# Patient Record
Sex: Male | Born: 1965 | Race: Black or African American | Hispanic: No | Marital: Married | Smoking: Former smoker
Health system: Southern US, Community
[De-identification: ages and names within clinical notes are randomized; demographics above are authoritative.]

---

## 2020-11-26 ENCOUNTER — Other Ambulatory Visit: Payer: Self-pay

## 2020-11-26 ENCOUNTER — Emergency Department (HOSPITAL_COMMUNITY)
Admission: EM | Admit: 2020-11-26 | Discharge: 2020-11-27 | Disposition: A | Discharge status: Home / Self Care | Payer: 59 | Attending: Physician Assistant | Admitting: Physician Assistant

## 2020-11-26 ENCOUNTER — Encounter (HOSPITAL_COMMUNITY): Payer: Self-pay | Admitting: Emergency Medicine

## 2020-11-26 ENCOUNTER — Emergency Department (HOSPITAL_COMMUNITY): Payer: 59

## 2020-11-26 DIAGNOSIS — M542 Cervicalgia: Secondary | ICD-10-CM | POA: Diagnosis not present

## 2020-11-26 DIAGNOSIS — M4312 Spondylolisthesis, cervical region: Secondary | ICD-10-CM | POA: Diagnosis not present

## 2020-11-26 DIAGNOSIS — Z041 Encounter for examination and observation following transport accident: Secondary | ICD-10-CM | POA: Diagnosis not present

## 2020-11-26 DIAGNOSIS — M5031 Other cervical disc degeneration,  high cervical region: Secondary | ICD-10-CM | POA: Diagnosis not present

## 2020-11-26 DIAGNOSIS — S0990XA Unspecified injury of head, initial encounter: Secondary | ICD-10-CM | POA: Diagnosis not present

## 2020-11-26 DIAGNOSIS — Y92410 Unspecified street and highway as the place of occurrence of the external cause: Secondary | ICD-10-CM | POA: Insufficient documentation

## 2020-11-26 DIAGNOSIS — M50321 Other cervical disc degeneration at C4-C5 level: Secondary | ICD-10-CM | POA: Diagnosis not present
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
## 2020-11-26 NOTE — ED Notes (Signed)
Pt called for triage, no answer, unable to locate pt.

## 2020-11-27 MED ORDER — METHOCARBAMOL 500 MG PO TABS: 500.0000 mg | ORAL_TABLET | Freq: Two times a day (BID) | ORAL | 0 refills | Status: AC

## 2020-11-27 MED ORDER — NAPROXEN 500 MG PO TABS: 500.0000 mg | ORAL_TABLET | Freq: Two times a day (BID) | ORAL | 0 refills | Status: AC
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
## 2020-11-27 NOTE — Discharge Instructions (Signed)
I have prescribed muscle relaxers for your pain, please do not drink or drive while taking this medications as they can make you drowsy.    I have also prescribed anti-inflammatories, please have 1 tablet twice a day for the next 7 days. Please follow-up with PCP in 1 week for reevaluation of your symptoms.  You experience any bowel or bladder incontinence, fever, worsening in your symptoms please return to the ED.

## 2021-01-28 DIAGNOSIS — Z125 Encounter for screening for malignant neoplasm of prostate: Secondary | ICD-10-CM | POA: Diagnosis not present

## 2021-01-28 DIAGNOSIS — Z0000 Encounter for general adult medical examination without abnormal findings: Secondary | ICD-10-CM | POA: Diagnosis not present

## 2021-01-28 DIAGNOSIS — Z0001 Encounter for general adult medical examination with abnormal findings: Secondary | ICD-10-CM | POA: Diagnosis not present

## 2021-07-15 DIAGNOSIS — Z01818 Encounter for other preprocedural examination: Secondary | ICD-10-CM | POA: Diagnosis not present

## 2021-07-15 DIAGNOSIS — Z1211 Encounter for screening for malignant neoplasm of colon: Secondary | ICD-10-CM | POA: Diagnosis not present

## 2021-10-17 ENCOUNTER — Encounter: Payer: Self-pay | Admitting: Gastroenterology

## 2021-10-18 ENCOUNTER — Encounter: Payer: Self-pay | Admitting: General Practice

## 2021-10-18 ENCOUNTER — Ambulatory Visit: Admission: RE | Admit: 2021-10-18 | Payer: 59 | Source: Home / Self Care | Admitting: Gastroenterology

## 2021-10-18 ENCOUNTER — Encounter: Admission: RE | Payer: Self-pay | Source: Home / Self Care

## 2021-10-18 ENCOUNTER — Encounter: Payer: Self-pay | Admitting: Gastroenterology

## 2021-10-18 SURGERY — COLONOSCOPY WITH PROPOFOL
Anesthesia: General

## 2021-12-27 DIAGNOSIS — H524 Presbyopia: Secondary | ICD-10-CM | POA: Diagnosis not present

## 2022-02-04 DIAGNOSIS — Z1211 Encounter for screening for malignant neoplasm of colon: Secondary | ICD-10-CM | POA: Diagnosis not present

## 2022-02-04 DIAGNOSIS — R972 Elevated prostate specific antigen [PSA]: Secondary | ICD-10-CM | POA: Diagnosis not present

## 2022-02-04 DIAGNOSIS — Z0001 Encounter for general adult medical examination with abnormal findings: Secondary | ICD-10-CM | POA: Diagnosis not present

## 2022-02-04 DIAGNOSIS — N539 Unspecified male sexual dysfunction: Secondary | ICD-10-CM | POA: Diagnosis not present

## 2022-02-24 ENCOUNTER — Encounter: Payer: Self-pay | Admitting: Urology

## 2022-02-24 ENCOUNTER — Ambulatory Visit (INDEPENDENT_AMBULATORY_CARE_PROVIDER_SITE_OTHER): Payer: 59 | Admitting: Urology

## 2022-02-24 VITALS — BP 143/89 | HR 67 | Ht 68.0 in | Wt 198.0 lb

## 2022-02-24 DIAGNOSIS — N3941 Urge incontinence: Secondary | ICD-10-CM | POA: Diagnosis not present

## 2022-02-24 DIAGNOSIS — N401 Enlarged prostate with lower urinary tract symptoms: Secondary | ICD-10-CM

## 2022-02-24 DIAGNOSIS — Z125 Encounter for screening for malignant neoplasm of prostate: Secondary | ICD-10-CM

## 2022-02-24 DIAGNOSIS — N3281 Overactive bladder: Secondary | ICD-10-CM

## 2022-02-24 DIAGNOSIS — R6882 Decreased libido: Secondary | ICD-10-CM

## 2022-02-24 NOTE — Patient Instructions (Signed)
Avoid sodas, diet drinks, diet Gatorade, as these can all irritate your bladder and contribute to urgency and frequency.  Recommend trying the Flomax(tamsulosin) that was prescribed by your primary doctor to help relax the prostate and improve your urinary symptoms.

## 2022-02-24 NOTE — Progress Notes (Signed)
   02/24/22 12:56 PM   Martin Osborn 08-Dec-1965 [id number]  CC: urinary symptoms, decreased sex drive, PSA screening  HPI: 56 year old male here today for the above issues.  He reports at least a few years of urinary leakage during the day with urgency and urge incontinence.  He has no problems overnight.  He also reports some weak stream.  He denies any dysuria or gross hematuria.  He was prescribed Flomax by his PCP a few weeks ago but never started that medication.  He drinks some soda and Gatorade during the day.  Urinalysis with PCP was benign.  He also reports decreased sex drive.  He denies any problems with erections.  PSA normal at 1.41  Social History:  reports that he has quit smoking. His smoking use included cigarettes. He has never used smokeless tobacco. He reports current alcohol use. He reports that he does not use drugs.  Physical Exam: BP (!) 143/89   Pulse 67   Ht 5\' 8"  (1.727 m)   Wt 198 lb (89.8 kg)   BMI 30.11 kg/m    Constitutional:  Alert and oriented, No acute distress. Cardiovascular: No clubbing, cyanosis, or edema. Respiratory: Normal respiratory effort, no increased work of breathing. GI: Abdomen is soft, nontender, nondistended, no abdominal masses   Assessment & Plan:   56 year old male here with urinary symptoms primarily urgency and urge incontinence during the day as well as decreased sex drive.  Denies any problems with erections.  Urinalysis and PSA normal.  He was prescribed Flomax by PCP but never started that medication.  Regarding his urinary symptoms we reviewed behavioral strategies including avoiding bladder irritants and timed voiding.  I also recommended he try the Flomax that was previously prescribed, and risks and benefits were discussed.  We discussed the difference in overlap between OAB and BPH, as well as other less common etiologies like urethral stricture.  Regarding his decreased sex drive, I recommended starting with a  morning total testosterone value.  He also recently had a B12 shot which may improve some of those symptoms.  -Morning testosterone, call with results -Trial of Flomax -Avoid bladder irritants -RTC 1 month IPSS, PVR, symptom check  41, MD 02/24/2022  [facility] [address], [address] [place], [place] [phone]

## 2022-02-24 NOTE — Addendum Note (Signed)
Addended by: Sueanne Margarita on: 02/24/2022 01:33 PM   Modules accepted: Orders

## 2022-03-05 ENCOUNTER — Other Ambulatory Visit: Payer: 59

## 2022-03-05 DIAGNOSIS — Z125 Encounter for screening for malignant neoplasm of prostate: Secondary | ICD-10-CM

## 2022-03-05 DIAGNOSIS — N3281 Overactive bladder: Secondary | ICD-10-CM | POA: Diagnosis not present

## 2022-03-05 DIAGNOSIS — R6882 Decreased libido: Secondary | ICD-10-CM

## 2022-03-05 DIAGNOSIS — N401 Enlarged prostate with lower urinary tract symptoms: Secondary | ICD-10-CM | POA: Diagnosis not present

## 2022-03-05 DIAGNOSIS — N138 Other obstructive and reflux uropathy: Secondary | ICD-10-CM

## 2022-03-06 ENCOUNTER — Telehealth: Payer: Self-pay

## 2022-03-06 DIAGNOSIS — R7989 Other specified abnormal findings of blood chemistry: Secondary | ICD-10-CM

## 2022-03-06 LAB — TESTOSTERONE: Testosterone: 126 ng/dL — ABNORMAL LOW (ref 264–916)

## 2022-03-06 NOTE — Telephone Encounter (Signed)
-----   Message from Sondra Come, MD sent at 03/06/2022  8:23 AM EST ----- Testosterone was low, please order a Testosterone and LH to be drawn prior to follow up appt  Legrand Rams, MD 03/06/2022

## 2022-03-06 NOTE — Telephone Encounter (Signed)
Called pt no answer. Left detailed message for pt informing him that he needs lab appt. Labs ordered.

## 2022-04-02 ENCOUNTER — Other Ambulatory Visit: Payer: 59

## 2022-04-02 ENCOUNTER — Encounter: Payer: Self-pay | Admitting: Urology

## 2022-04-10 ENCOUNTER — Ambulatory Visit (INDEPENDENT_AMBULATORY_CARE_PROVIDER_SITE_OTHER): Payer: 59 | Admitting: Urology

## 2022-04-10 ENCOUNTER — Encounter: Payer: Self-pay | Admitting: Urology

## 2022-04-10 VITALS — BP 142/84 | HR 62 | Ht 68.0 in | Wt 200.0 lb

## 2022-04-10 DIAGNOSIS — R6882 Decreased libido: Secondary | ICD-10-CM | POA: Diagnosis not present

## 2022-04-10 DIAGNOSIS — N138 Other obstructive and reflux uropathy: Secondary | ICD-10-CM

## 2022-04-10 DIAGNOSIS — N401 Enlarged prostate with lower urinary tract symptoms: Secondary | ICD-10-CM

## 2022-04-10 LAB — BLADDER SCAN AMB NON-IMAGING

## 2022-04-10 NOTE — Progress Notes (Signed)
   04/10/2022 3:49 PM   Martin Osborn 07-02-1965 [id number]  Reason for visit: Follow up urinary symptoms, decreased libido  HPI: 57 year old male who I originally saw on 02/24/2022 for the above issues.  At that point he was having at least a few years of some urinary leakage during the day with urgency and urge incontinence, but no problems overnight.  Urinalysis and PVR were normal, PSA normal at 1.41.  PCP had prescribed Flomax but he had never taken that medication, at our visit in December 2023 I recommended trialing that medication to see if he had any improvement in his urinary symptoms.  He also reported decreased sex drive but no problems with erections, and a testosterone was low at 126.  I recommended a follow-up morning testosterone and LH per the guideline recommendations, but he never had those labs drawn.  He is a very challenging historian.  PVR today is normal at 15 mL.  He does think the Flomax has improved his urinary stream but he still reports some occasional discomfort or burning with urination of unclear etiology.  He still does have some urgency.  He is continues to drink soda.  We discussed bladder irritants at length.  I also recommended the repeat testosterone as discussed.  Follow-up repeat testosterone with LH, consider replacement for his decreased  Could consider an OAB medication if persistent urinary symptoms or cystoscopy for further evaluation   Billey Co, MD  [place] [address], [place], [place] [address] [phone]

## 2022-04-15 ENCOUNTER — Other Ambulatory Visit: Payer: 59

## 2022-04-15 DIAGNOSIS — N138 Other obstructive and reflux uropathy: Secondary | ICD-10-CM | POA: Diagnosis not present

## 2022-04-15 DIAGNOSIS — N401 Enlarged prostate with lower urinary tract symptoms: Secondary | ICD-10-CM | POA: Diagnosis not present

## 2022-04-16 LAB — LUTEINIZING HORMONE: LH: 4 m[IU]/mL (ref 1.7–8.6)

## 2022-04-16 LAB — TESTOSTERONE: Testosterone: 328 ng/dL (ref 264–916)

## 2022-04-17 NOTE — Progress Notes (Unsigned)
    04/18/2022 3:46 PM   Martin Osborn Nov 11, 1965 [id number]  Referring provider: No referring provider defined for this encounter.  Urological history: 1. BPH with LU TS -PSA (01/2022) 1.41 -tamsulosin 0.4 mg daily   2. ED -contributing factors of age, testosterone deficiency, BPH, HLD, history of smoking and alcohol consumption  Chief Complaint  Patient presents with   Follow-up    HPI: Martin Osborn is a 57 y.o. male who presents today to discuss labs.    He has been having issues with low libido.  An afternoon testosterone was found to be low, but a repeated morning testosterone was found to be within normal limits.    He is a difficult historian and also has poor insight to his medical issues.  He was under the impression that he was taking B12 to help with his libido issues.  He states his urinary symptoms are improved, but I cannot be sure if he is taking the tamsulosin.  Patient denies any modifying or aggravating factors.  Patient denies any gross hematuria, dysuria or suprapubic/flank pain.  Patient denies any fevers, chills, nausea or vomiting.     UA yellow clear, specific gravity 1.015, pH 5.5, 0-5 WBCs, 0-2 RBCs and 0-10 epithelial cells  PMH: No past medical history on file.  Surgical History: No past surgical history on file.  Home Medications:  Allergies as of 04/18/2022   No Known Allergies      Medication List        Accurate as of April 18, 2022 11:59 PM. If you have any questions, ask your nurse or doctor.          tamsulosin 0.4 MG Caps capsule Commonly known as: FLOMAX Take 0.4 mg by mouth daily.        Allergies: No Known Allergies  Family History: No family history on file.  Social History:  reports that he has quit smoking. His smoking use included cigarettes. He has been exposed to tobacco smoke. He has never used smokeless tobacco. He reports current alcohol use. He reports that he does not use  drugs.  ROS: Pertinent ROS in HPI  Physical Exam: BP 138/85   Pulse 67   Ht 5\' 7"  (1.702 m)   Wt 197 lb (89.4 kg)   BMI 30.85 kg/m   Constitutional:  Well nourished. Alert and oriented, No acute distress. HEENT: [place] AT, moist mucus membranes.  Trachea midline Cardiovascular: No clubbing, cyanosis, or edema. Respiratory: Normal respiratory effort, no increased work of breathing. Neurologic: Grossly intact, no focal deficits, moving all 4 extremities. Psychiatric: Normal mood and affect.  Laboratory Data: Lab Results  Component Value Date   TESTOSTERONE 230 (L) 04/18/2022   Component     Latest Ref Rng 04/15/2022  LH     1.7 - 8.6 mIU/mL 4.0     Urinalysis See EPIC and HPI I have reviewed the labs.   Pertinent Imaging: N/A  Assessment & Plan:    1. Low libido -morning testosterone within normal limits -explained that we need a repeat morning testosterone with a free and total   2. LU TS -Not completely convinced that his lower urinary tract symptoms have improved, but we will keep addressing at future visits.  Return for pending testosterone results .  These notes generated with voice recognition software. I apologize for typographical errors.  [place], [place] [address]  [place], [place] [address] [phone]

## 2022-04-18 ENCOUNTER — Encounter: Payer: Self-pay | Admitting: Urology

## 2022-04-18 ENCOUNTER — Ambulatory Visit (INDEPENDENT_AMBULATORY_CARE_PROVIDER_SITE_OTHER): Payer: 59 | Admitting: Urology

## 2022-04-18 VITALS — BP 138/85 | HR 67 | Ht 67.0 in | Wt 197.0 lb

## 2022-04-18 DIAGNOSIS — R7989 Other specified abnormal findings of blood chemistry: Secondary | ICD-10-CM | POA: Diagnosis not present

## 2022-04-18 DIAGNOSIS — N138 Other obstructive and reflux uropathy: Secondary | ICD-10-CM

## 2022-04-18 DIAGNOSIS — R6882 Decreased libido: Secondary | ICD-10-CM

## 2022-04-18 DIAGNOSIS — N401 Enlarged prostate with lower urinary tract symptoms: Secondary | ICD-10-CM | POA: Diagnosis not present

## 2022-04-18 LAB — URINALYSIS, COMPLETE
Bilirubin, UA: NEGATIVE
Glucose, UA: NEGATIVE
Ketones, UA: NEGATIVE
Leukocytes,UA: NEGATIVE
Nitrite, UA: NEGATIVE
Protein,UA: NEGATIVE
RBC, UA: NEGATIVE
Specific Gravity, UA: 1.015 (ref 1.005–1.030)
Urobilinogen, Ur: 1 mg/dL (ref 0.2–1.0)
pH, UA: 5.5 (ref 5.0–7.5)

## 2022-04-18 LAB — MICROSCOPIC EXAMINATION: Bacteria, UA: NONE SEEN

## 2022-05-01 LAB — TESTOSTERONE,FREE AND TOTAL
Testosterone, Free: 2.9 pg/mL — ABNORMAL LOW (ref 7.2–24.0)
Testosterone: 230 ng/dL — ABNORMAL LOW (ref 264–916)

## 2022-06-15 ENCOUNTER — Emergency Department
Admission: EM | Admit: 2022-06-15 | Discharge: 2022-06-15 | Disposition: A | Discharge status: Home / Self Care | Payer: BC Managed Care – PPO | Attending: Emergency Medicine | Admitting: Emergency Medicine

## 2022-06-15 ENCOUNTER — Emergency Department: Payer: BC Managed Care – PPO

## 2022-06-15 ENCOUNTER — Other Ambulatory Visit: Payer: Self-pay

## 2022-06-15 DIAGNOSIS — I10 Essential (primary) hypertension: Secondary | ICD-10-CM | POA: Diagnosis not present

## 2022-06-15 DIAGNOSIS — E119 Type 2 diabetes mellitus without complications: Secondary | ICD-10-CM | POA: Diagnosis not present

## 2022-06-15 DIAGNOSIS — R55 Syncope and collapse: Secondary | ICD-10-CM | POA: Diagnosis not present

## 2022-06-15 LAB — CBC
HCT: 43 % (ref 39.0–52.0)
Hemoglobin: 14.2 g/dL (ref 13.0–17.0)
MCH: 25.6 pg — ABNORMAL LOW (ref 26.0–34.0)
MCHC: 33 g/dL (ref 30.0–36.0)
MCV: 77.5 fL — ABNORMAL LOW (ref 80.0–100.0)
Platelets: 158 10*3/uL (ref 150–400)
RBC: 5.55 MIL/uL (ref 4.22–5.81)
RDW: 13.4 % (ref 11.5–15.5)
WBC: 4.8 10*3/uL (ref 4.0–10.5)
nRBC: 0 % (ref 0.0–0.2)

## 2022-06-15 LAB — BASIC METABOLIC PANEL
Anion gap: 5 (ref 5–15)
BUN: 15 mg/dL (ref 6–20)
CO2: 27 mmol/L (ref 22–32)
Calcium: 8.6 mg/dL — ABNORMAL LOW (ref 8.9–10.3)
Chloride: 102 mmol/L (ref 98–111)
Creatinine, Ser: 0.93 mg/dL (ref 0.61–1.24)
GFR, Estimated: >60 mL/min (ref >60)
Glucose, Bld: 106 mg/dL — ABNORMAL HIGH (ref 70–99)
Potassium: 4 mmol/L (ref 3.5–5.1)
Sodium: 134 mmol/L — ABNORMAL LOW (ref 135–145)

## 2022-06-15 LAB — TROPONIN I (HIGH SENSITIVITY): Troponin I (High Sensitivity): <2 ng/L (ref <18)
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
## 2022-06-15 NOTE — ED Provider Notes (Signed)
[facility] Provider Note   Event Date/Time   First MD Initiated Contact with Patient 06/15/22 1400     (approximate) History  Loss of Consciousness  HPI Martin Osborn is a 57 y.o. male with a stated past medical history of hypertension and diabetes who presents after a syncopal event that occurred at church just prior to arrival.  Patient arrives via EMS after he had an event in which she was kneeling at the alter and lost consciousness for approximately 30 seconds.  Patient states that while he was kneeling for prayer in front of an alter, he began to feel slightly short of breath and flushed.  Patient states that the next thing he remembers is waking up on the ground with people around him asking him if he is okay.  Patient's wife at bedside and provides further history stating that while patient was kneeling at the ulcer, she noticed him to somewhat slumped over before falling backwards with his eyes open and breathing spontaneously for less than 10 seconds before regaining consciousness and asking why everyone is standing around him.  Wife notes that patient returned to baseline within 2 minutes of waking up.  Patient states that he has not had any similar symptoms in the past nor has he had any since this event.  ROS: Patient currently denies any vision changes, tinnitus, difficulty speaking, facial droop, sore throat, chest pain, shortness of breath, abdominal pain, nausea/vomiting/diarrhea, dysuria, or weakness/numbness/paresthesias in any extremity   Physical Exam  Triage Vital Signs: ED Triage Vitals  Enc Vitals Group     BP 06/15/22 1321 135/88     Pulse Rate 06/15/22 1321 66     Resp 06/15/22 1321 15     Temp 06/15/22 1321 97.7 F (36.5 C)     Temp Source 06/15/22 1321 Oral     SpO2 06/15/22 1321 100 %     Weight 06/15/22 1322 198 lb (89.8 kg)     Height 06/15/22 1322 5\' 8"  (1.727 m)     Head Circumference --      Peak Flow --      Pain Score  06/15/22 1322 0     Pain Loc --      Pain Edu? --      Excl. in GC? --    Most recent vital signs: Vitals:   06/15/22 1321 06/15/22 1545  BP: 135/88 (!) 141/84  Pulse: 66 69  Resp: 15 16  Temp: 97.7 F (36.5 C) 97.8 F (36.6 C)  SpO2: 100% 99%   General: Awake, oriented x4. CV:  Good peripheral perfusion.  Resp:  Normal effort.  Abd:  No distention.  Other:  Middle-aged overweight African-American male laying in bed in no acute distress ED Results / Procedures / Treatments  Labs (all labs ordered are listed, but only abnormal results are displayed) Labs Reviewed  BASIC METABOLIC PANEL - Abnormal; Notable for the following components:      Result Value   Sodium 134 (*)    Glucose, Bld 106 (*)    Calcium 8.6 (*)    All other components within normal limits  CBC - Abnormal; Notable for the following components:   MCV 77.5 (*)    MCH 25.6 (*)    All other components within normal limits  TROPONIN I (HIGH SENSITIVITY)   EKG ED ECG REPORT I, Merwyn Katos, the attending physician, personally viewed and interpreted this ECG. Date: 06/15/2022 EKG Time: 1318 Rate: 63 Rhythm:  normal sinus rhythm QRS Axis: normal Intervals: normal ST/T Wave abnormalities: normal Narrative Interpretation: no evidence of acute ischemia RADIOLOGY ED MD interpretation: 2 view chest x-ray interpreted by me shows no evidence of acute abnormalities including no pneumonia, pneumothorax, or widened mediastinum -Agree with radiology assessment Official radiology report(s): DG Chest 2 View  Result Date: 06/15/2022 CLINICAL DATA:  syncope EXAM: CHEST - 2 VIEW COMPARISON:  None Available. FINDINGS: The cardiomediastinal silhouette is normal in contour. No pleural effusion. No pneumothorax. No acute pleuroparenchymal abnormality. Visualized abdomen is unremarkable. Mild degenerative changes of the thoracic spine. IMPRESSION: No acute cardiopulmonary abnormality. Electronically Signed   By: Meda Klinefelter M.D.   On: 06/15/2022 13:46   PROCEDURES: Critical Care performed: No .1-3 Lead EKG Interpretation  Performed by: Merwyn Katos, MD Authorized by: Merwyn Katos, MD     Interpretation: normal     ECG rate:  71   ECG rate assessment: normal     Rhythm: sinus rhythm     Ectopy: none     Conduction: normal    MEDICATIONS ORDERED IN ED: Medications - No data to display IMPRESSION / MDM / ASSESSMENT AND PLAN / ED COURSE  I reviewed the triage vital signs and the nursing notes.                             The patient is on the cardiac monitor to evaluate for evidence of arrhythmia and/or significant heart rate changes. Patient's presentation is most consistent with acute presentation with potential threat to life or bodily function. Patient presents with complaints of syncope/presyncope ED Workup:  CBC, BMP, Troponin, BNP, ECG, CXR Differential diagnosis includes HF, ICH, seizure, stroke, HOCM, ACS, aortic dissection, malignant arrhythmia, or GI bleed. Findings: No evidence of acute laboratory abnormalities.  Troponin negative x1 EKG: No e/o STEMI. No evidence of Brugadas sign, delta wave, epsilon wave, significantly prolonged QTc, or malignant arrhythmia.  Disposition: Discharge. Patient is at baseline at this time. Return precautions expressed and understood in person. Advised follow up with primary care provider or clinic physician in next 24 hours.   FINAL CLINICAL IMPRESSION(S) / ED DIAGNOSES   Final diagnoses:  Syncope and collapse   Rx / DC Orders   ED Discharge Orders          Ordered    Ambulatory Referral to Primary Care (Establish Care)        06/15/22 1511           Note:  This document was prepared using Dragon voice recognition software and may include unintentional dictation errors.   Merwyn Katos, MD 06/15/22 [phone]

## 2022-06-15 NOTE — ED Notes (Signed)
AVS provided to and discussed with patient and family member at bedside. Pt verbalizes understanding of discharge instructions and denies any questions or concerns at this time. Pt has ride home. Pt ambulated out of department independently with steady gait.  

## 2022-06-15 NOTE — ED Notes (Signed)
BLOOD COLLECTED AT 1358 IN TRIAGE

## 2022-06-15 NOTE — ED Triage Notes (Signed)
[facility] for syncopal episode while at church. Pt was kneeling at altar, fell backward onto carpet. No blood thinners. Pt is alert and oriented. Denies known injuries. Denies chest pain. EKG performed.

## 2022-08-15 ENCOUNTER — Encounter: Payer: Self-pay | Admitting: Physician Assistant

## 2022-08-15 ENCOUNTER — Other Ambulatory Visit: Payer: Self-pay

## 2022-08-15 ENCOUNTER — Ambulatory Visit (INDEPENDENT_AMBULATORY_CARE_PROVIDER_SITE_OTHER): Payer: BC Managed Care – PPO | Admitting: Physician Assistant

## 2022-08-15 VITALS — BP 128/88 | HR 70 | Temp 98.4°F | Ht 67.99 in | Wt 200.3 lb

## 2022-08-15 DIAGNOSIS — Z6830 Body mass index (BMI) 30.0-30.9, adult: Secondary | ICD-10-CM | POA: Diagnosis not present

## 2022-08-15 DIAGNOSIS — E7849 Other hyperlipidemia: Secondary | ICD-10-CM

## 2022-08-15 DIAGNOSIS — E6609 Other obesity due to excess calories: Secondary | ICD-10-CM | POA: Diagnosis not present

## 2022-08-15 DIAGNOSIS — R7989 Other specified abnormal findings of blood chemistry: Secondary | ICD-10-CM

## 2022-08-15 DIAGNOSIS — N401 Enlarged prostate with lower urinary tract symptoms: Secondary | ICD-10-CM | POA: Diagnosis not present

## 2022-08-15 DIAGNOSIS — Z7689 Persons encountering health services in other specified circumstances: Secondary | ICD-10-CM | POA: Diagnosis not present

## 2022-08-15 DIAGNOSIS — R55 Syncope and collapse: Secondary | ICD-10-CM

## 2022-08-15 DIAGNOSIS — N138 Other obstructive and reflux uropathy: Secondary | ICD-10-CM

## 2022-08-15 DIAGNOSIS — K219 Gastro-esophageal reflux disease without esophagitis: Secondary | ICD-10-CM

## 2022-08-15 MED ORDER — FAMOTIDINE 10 MG PO TABS
10.0000 mg | ORAL_TABLET | Freq: Two times a day (BID) | ORAL | 0 refills | Status: DC
Start: 2022-08-15 — End: 2023-03-30

## 2022-08-15 NOTE — Progress Notes (Unsigned)
New patient visit  Patient: Martin Osborn   DOB: May 05, 1965   57 y.o. Male  MRN: [id number] Visit Date: 08/15/2022  Today's healthcare provider: Debera Lat, PA-C   No chief complaint on file.  Subjective    Martin Osborn is a 57 y.o. male who presents today as a new patient to establish care.  HPI  ***     No data to display           No past medical history on file. No past surgical history on file. No family status information on file.   No family history on file. Social History   Socioeconomic History   Marital status: Married    Spouse name: Not on file   Number of children: Not on file   Years of education: Not on file   Highest education level: Not on file  Occupational History   Not on file  Tobacco Use   Smoking status: Former    Types: Cigarettes    Passive exposure: Past   Smokeless tobacco: Never  Vaping Use   Vaping Use: Never used  Substance and Sexual Activity   Alcohol use: Yes    Comment: occasionally   Drug use: Never   Sexual activity: Not on file  Other Topics Concern   Not on file  Social History Narrative   Not on file   Social Determinants of Health   Financial Resource Strain: Not on file  Food Insecurity: Not on file  Transportation Needs: Not on file  Physical Activity: Not on file  Stress: Not on file  Social Connections: Not on file   Outpatient Medications Prior to Visit  Medication Sig   tamsulosin (FLOMAX) 0.4 MG CAPS capsule Take 0.4 mg by mouth daily.   No facility-administered medications prior to visit.   No Known Allergies   There is no immunization history on file for this patient.  Health Maintenance  Topic Date Due   COVID-19 Vaccine (1) Never done   HIV Screening  Never done   Hepatitis C Screening  Never done   DTaP/Tdap/Td (1 - Tdap) Never done   Colonoscopy  Never done   Zoster Vaccines- Shingrix (1 of 2) Never done   INFLUENZA VACCINE  10/09/2022   HPV VACCINES  Aged Out    Patient  Care Team: Pcp, No as PCP - General  Review of Systems  All other systems reviewed and are negative.  Except see HPI   {Labs  Heme  Chem  Endocrine  Serology  Results Review (optional):23779}   Objective    There were no vitals taken for this visit. {Show previous vital signs (optional):23777}  Physical Exam Vitals reviewed.  Constitutional:      General: He is not in acute distress.    Appearance: Normal appearance. He is not diaphoretic.  HENT:     Head: Normocephalic and atraumatic.  Eyes:     General: No scleral icterus.    Conjunctiva/sclera: Conjunctivae normal.  Cardiovascular:     Rate and Rhythm: Normal rate and regular rhythm.     Pulses: Normal pulses.     Heart sounds: Normal heart sounds. No murmur heard. Pulmonary:     Effort: Pulmonary effort is normal. No respiratory distress.     Breath sounds: Normal breath sounds. No wheezing or rhonchi.  Musculoskeletal:     Cervical back: Neck supple.     Right lower leg: No edema.     Left lower leg: No edema.  Lymphadenopathy:     Cervical: No cervical adenopathy.  Skin:    General: Skin is warm and dry.     Findings: No rash.  Neurological:     Mental Status: He is alert and oriented to person, place, and time. Mental status is at baseline.  Psychiatric:        Mood and Affect: Mood normal.        Behavior: Behavior normal.     Depression Screen     No data to display         No results found for any visits on 08/15/22.  Assessment & Plan     *** Encounter to establish care Welcomed to our clinic Reviewed past medical hx, social hx, family hx and surgical hx Pt advised to send all vaccination records or screening   No follow-ups on file.    The patient was advised to call back or seek an in-person evaluation if the symptoms worsen or if the condition fails to improve as anticipated.  I discussed the assessment and treatment plan with the patient. The patient was provided an opportunity  to ask questions and all were answered. The patient agreed with the plan and demonstrated an understanding of the instructions.  I, Debera Lat, PA-C have reviewed all documentation for this visit. The documentation on 08/15/22  for the exam, diagnosis, procedures, and orders are all accurate and complete.  Debera Lat, [facility], MMS [facility] [phone] (phone) [phone] (fax)  [facility]

## 2022-08-16 DIAGNOSIS — R7989 Other specified abnormal findings of blood chemistry: Secondary | ICD-10-CM | POA: Insufficient documentation

## 2022-08-16 DIAGNOSIS — E7849 Other hyperlipidemia: Secondary | ICD-10-CM | POA: Insufficient documentation

## 2022-08-16 DIAGNOSIS — E6609 Other obesity due to excess calories: Secondary | ICD-10-CM | POA: Insufficient documentation

## 2022-08-16 DIAGNOSIS — K219 Gastro-esophageal reflux disease without esophagitis: Secondary | ICD-10-CM | POA: Insufficient documentation

## 2022-08-16 NOTE — Assessment & Plan Note (Signed)
Managed by Urology

## 2022-08-16 NOTE — Assessment & Plan Note (Signed)
Previous labs showed elevated cholesterol. -Order lipid panel.

## 2022-08-16 NOTE — Assessment & Plan Note (Signed)
Chronic BMI today was 30.46 Advised initial workup and  Weight loss of 5% of pt's current weight via healthy diet and daily exercise encouraged. - Comprehensive metabolic panel - Hemoglobin A1c - Lipid panel - TSH .joExcept see HPIExcept see HPIWill reassess after  receiving lab results Will reassess after  receiving lab results

## 2022-08-16 NOTE — Assessment & Plan Note (Addendum)
Heartburn: Patient reports worsening heartburn, no current treatment. -Start over-the-counter Pepcid 10mg  twice daily. - famotidine (PEPCID) 10 MG tablet; Take 1 tablet (10 mg total) by mouth 2 (two) times daily.  Dispense: 60 tablet; Refill: 0 -Advise patient to avoid trigger foods and to elevate head of bed. Will need to be reassess in a mo or 6 weeks for CPE or initial treatment.

## 2022-08-26 ENCOUNTER — Ambulatory Visit (INDEPENDENT_AMBULATORY_CARE_PROVIDER_SITE_OTHER): Payer: BC Managed Care – PPO | Admitting: Physician Assistant

## 2022-08-26 ENCOUNTER — Encounter: Payer: Self-pay | Admitting: Physician Assistant

## 2022-08-26 VITALS — BP 123/79 | HR 70 | Temp 97.9°F | Wt 197.0 lb

## 2022-08-26 DIAGNOSIS — Z1211 Encounter for screening for malignant neoplasm of colon: Secondary | ICD-10-CM

## 2022-08-26 DIAGNOSIS — K219 Gastro-esophageal reflux disease without esophagitis: Secondary | ICD-10-CM | POA: Diagnosis not present

## 2022-08-26 NOTE — Progress Notes (Signed)
Patient: Martin Osborn   DOB: 07/20/1965   57 y.o. Male  MRN: [id number] Visit Date: 08/26/2022  Today's healthcare provider: Debera Lat, PA-C   Chief Complaint  Patient presents with   Gastroesophageal Reflux   Subjective      HPI   GERD-Patient is a 57 year old male who presents for follow up of acid reflux.  He was last seen on 08/15/22 and was given Pepcid.  He states his symptoms have improved and he is no longer having any burning in his chest.  Last depression screening scores    08/15/2022    4:04 PM  PHQ 2/9 Scores  PHQ - 2 Score 3  PHQ- 9 Score 5    Last Audit-C alcohol use screening    08/15/2022    4:04 PM  Alcohol Use Disorder Test (AUDIT)  1. How often do you have a drink containing alcohol? 2  2. How many drinks containing alcohol do you have on a typical day when you are drinking? 0  3. How often do you have six or more drinks on one occasion? 0  AUDIT-C Score 2   A score of 3 or more in women, and 4 or more in men indicates increased risk for alcohol abuse, EXCEPT if all of the points are from question 1   No past medical history on file. No past surgical history on file. Social History   Socioeconomic History   Marital status: Married    Spouse name: Not on file   Number of children: Not on file   Years of education: Not on file   Highest education level: Not on file  Occupational History   Not on file  Tobacco Use   Smoking status: Former    Types: Cigarettes    Passive exposure: Past   Smokeless tobacco: Never  Vaping Use   Vaping Use: Never used  Substance and Sexual Activity   Alcohol use: Yes    Alcohol/week: 2.0 standard drinks of alcohol    Types: 2 Cans of beer per week    Comment: occasionally   Drug use: Never   Sexual activity: Not Currently  Other Topics Concern   Not on file  Social History Narrative   Not on file   Social Determinants of Health   Financial Resource Strain: Not on file  Food Insecurity:  Not on file  Transportation Needs: Not on file  Physical Activity: Not on file  Stress: Not on file  Social Connections: Not on file  Intimate Partner Violence: Not on file   No family status information on file.   No family history on file. No Known Allergies  Patient Care Team: Debera Lat, PA-C as PCP - General (Physician Assistant)   Medications: Outpatient Medications Prior to Visit  Medication Sig   famotidine (PEPCID) 10 MG tablet Take 1 tablet (10 mg total) by mouth 2 (two) times daily.   tamsulosin (FLOMAX) 0.4 MG CAPS capsule Take 0.4 mg by mouth daily.   No facility-administered medications prior to visit.    Review of Systems  Constitutional: Negative.   HENT: Negative.    Eyes: Negative.   Respiratory: Negative.    Cardiovascular: Negative.   Gastrointestinal: Negative.   Endocrine: Negative.   Genitourinary: Negative.   Musculoskeletal: Negative.   Skin: Negative.   Allergic/Immunologic: Negative.   Neurological: Negative.   Hematological: Negative.   Psychiatric/Behavioral: Negative.     Except see HPI  Objective    BP 123/79 (BP Location: Left Arm, Patient Position: Sitting, Cuff Size: Normal)   Pulse 70   Temp 97.9 F (36.6 C) (Oral)   Wt 197 lb (89.4 kg)   SpO2 98%   BMI 29.96 kg/m     Physical Exam Constitutional:      General: He is not in acute distress.    Appearance: Normal appearance. He is not diaphoretic.  HENT:     Head: Normocephalic.  Eyes:     Conjunctiva/sclera: Conjunctivae normal.  Pulmonary:     Effort: Pulmonary effort is normal. No respiratory distress.  Neurological:     Mental Status: He is alert and oriented to person, place, and time. Mental status is at baseline.      No results found for any visits on 08/26/22.  Assessment & Plan     1. Gastroesophageal reflux disease without esophagitis Chronic and well controlled on Pepcid Continue lifestyle modifications Elevate the head of the bed 6-8  inches, avoid recumbency for 3 hours after eating, avoid trigger food , weight loss advised.  2. Colon cancer screening overdue - Ambulatory referral to gastroenterology for colonoscopy Will reassess   Return in about 6 months (around 02/25/2023) for CPE.    The patient was advised to call back or seek an in-person evaluation if the symptoms worsen or if the condition fails to improve as anticipated.  I discussed the assessment and treatment plan with the patient. The patient was provided an opportunity to ask questions and all were answered. The patient agreed with the plan and demonstrated an understanding of the instructions.  I, Debera Lat, PA-C have reviewed all documentation for this visit. The documentation on  08/26/22  for the exam, diagnosis, procedures, and orders are all accurate and complete.  Debera Lat, [facility], MMS [facility] [phone] (phone) [phone] (fax)  [facility]

## 2022-09-03 ENCOUNTER — Encounter: Payer: Self-pay | Admitting: *Deleted
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
## 2023-02-04 ENCOUNTER — Telehealth: Payer: Self-pay | Admitting: Physician Assistant

## 2023-03-06 ENCOUNTER — Encounter: Payer: 59 | Admitting: Physician Assistant

## 2023-03-30 ENCOUNTER — Encounter: Payer: Self-pay | Admitting: Physician Assistant

## 2023-03-30 ENCOUNTER — Ambulatory Visit (INDEPENDENT_AMBULATORY_CARE_PROVIDER_SITE_OTHER): Payer: 59 | Admitting: Physician Assistant

## 2023-03-30 VITALS — BP 135/80 | HR 60 | Resp 16 | Ht 67.5 in | Wt 201.9 lb

## 2023-03-30 DIAGNOSIS — K219 Gastro-esophageal reflux disease without esophagitis: Secondary | ICD-10-CM

## 2023-03-30 DIAGNOSIS — Z23 Encounter for immunization: Secondary | ICD-10-CM

## 2023-03-30 DIAGNOSIS — Z0001 Encounter for general adult medical examination with abnormal findings: Secondary | ICD-10-CM

## 2023-03-30 DIAGNOSIS — Z1159 Encounter for screening for other viral diseases: Secondary | ICD-10-CM

## 2023-03-30 DIAGNOSIS — Z114 Encounter for screening for human immunodeficiency virus [HIV]: Secondary | ICD-10-CM

## 2023-03-30 DIAGNOSIS — Z0000 Encounter for general adult medical examination without abnormal findings: Secondary | ICD-10-CM

## 2023-03-30 MED ORDER — FAMOTIDINE 10 MG PO TABS
10.0000 mg | ORAL_TABLET | Freq: Two times a day (BID) | ORAL | 0 refills | Status: AC
Start: 2023-03-30 — End: ?

## 2023-03-30 NOTE — Progress Notes (Unsigned)
Complete physical exam  Patient: Martin Osborn   DOB: June 22, 1965   58 y.o. Male  MRN: [id number] Visit Date: 03/30/2023  Today's healthcare provider: Debera Lat, PA-C   Chief Complaint  Patient presents with   Annual Exam   Subjective    Martin Osborn is a 58 y.o. male who presents today for a complete physical exam.  He reports consuming a {diet types:17450} diet. {Exercise:19826} He generally feels {well/fairly well/poorly:18703}. He reports sleeping {well/fairly well/poorly:18703}. He {does/does not:200015} have additional problems to discuss today.  HPI  *** Discussed the use of AI scribe software for clinical note transcription with the patient, who gave verbal consent to proceed.  History of Present Illness            Last depression screening scores    08/15/2022    4:04 PM  PHQ 2/9 Scores  PHQ - 2 Score 3  PHQ- 9 Score 5   Last fall risk screening     No data to display         Last Audit-C alcohol use screening    08/15/2022    4:04 PM  Alcohol Use Disorder Test (AUDIT)  1. How often do you have a drink containing alcohol? 2  2. How many drinks containing alcohol do you have on a typical day when you are drinking? 0  3. How often do you have six or more drinks on one occasion? 0  AUDIT-C Score 2   A score of 3 or more in women, and 4 or more in men indicates increased risk for alcohol abuse, EXCEPT if all of the points are from question 1   History reviewed. No pertinent past medical history. History reviewed. No pertinent surgical history. Social History   Socioeconomic History   Marital status: Married    Spouse name: Not on file   Number of children: Not on file   Years of education: Not on file   Highest education level: Not on file  Occupational History   Not on file  Tobacco Use   Smoking status: Former    Types: Cigarettes    Passive exposure: Past   Smokeless tobacco: Never  Vaping Use   Vaping status: Never Used   Substance and Sexual Activity   Alcohol use: Yes    Alcohol/week: 2.0 standard drinks of alcohol    Types: 2 Cans of beer per week    Comment: occasionally   Drug use: Never   Sexual activity: Not Currently  Other Topics Concern   Not on file  Social History Narrative   Not on file   Social Drivers of Health   Financial Resource Strain: Not on file  Food Insecurity: Not on file  Transportation Needs: Not on file  Physical Activity: Not on file  Stress: Not on file  Social Connections: Not on file  Intimate Partner Violence: Not on file   No family status information on file.   History reviewed. No pertinent family history. No Known Allergies  Patient Care Team: Debera Lat, PA-C as PCP - General (Physician Assistant)   Medications: Outpatient Medications Prior to Visit  Medication Sig   famotidine (PEPCID) 10 MG tablet Take 1 tablet (10 mg total) by mouth 2 (two) times daily.   No facility-administered medications prior to visit.    Review of Systems Except see HPI  {Insert previous labs (optional):23779} {See past labs  Heme  Chem  Endocrine  Serology  Results Review (optional):1}  Objective    BP 135/80 (BP Location: Right Arm, Patient Position: Sitting, Cuff Size: Large)   Pulse 60   Resp 16   Ht 5' 7.5" (1.715 m)   Wt 201 lb 14.4 oz (91.6 kg)   BMI 31.16 kg/m  {Insert last BP/Wt (optional):23777}{See vitals history (optional):1}    Physical Exam   No results found for any visits on 03/30/23.  Assessment & Plan    Routine Health Maintenance and Physical Exam  Exercise Activities and Dietary recommendations  Goals   None      There is no immunization history on file for this patient.  Health Maintenance  Topic Date Due   HIV Screening  Never done   Hepatitis C Screening  Never done   DTaP/Tdap/Td vaccine (1 - Tdap) Never done   Colon Cancer Screening  Never done   Zoster (Shingles) Vaccine (1 of 2) Never done   Flu Shot  Never  done   COVID-19 Vaccine (1 - 2024-25 season) Never done   HPV Vaccine  Aged Out    Discussed health benefits of physical activity, and encouraged him to engage in regular exercise appropriate for his age and condition.  Assessment and Plan              ***  No follow-ups on file.    The patient was advised to call back or seek an in-person evaluation if the symptoms worsen or if the condition fails to improve as anticipated.  I discussed the assessment and treatment plan with the patient. The patient was provided an opportunity to ask questions and all were answered. The patient agreed with the plan and demonstrated an understanding of the instructions.  I, Debera Lat, PA-C have reviewed all documentation for this visit. The documentation on 03/30/2023  for the exam, diagnosis, procedures, and orders are all accurate and complete.  Debera Lat, [facility], MMS [facility] [phone] (phone) [phone] (fax)  [facility]

## 2023-04-18 LAB — HIV ANTIBODY (ROUTINE TESTING W REFLEX): HIV Screen 4th Generation wRfx: NONREACTIVE

## 2023-04-18 LAB — HEPATITIS C ANTIBODY: Hep C Virus Ab: NONREACTIVE

## 2023-04-19 ENCOUNTER — Encounter: Payer: Self-pay | Admitting: Physician Assistant

## 2023-12-02 NOTE — Progress Notes (Signed)
 Martin Osborn                                          MRN: [id number]   12/02/2023   The [facility] Quality Team Specialist reviewed this patient medical record for the purposes of chart review for care gap closure. The following were reviewed: chart review for care gap closure-colorectal cancer screening.    [facility] Quality Team

## 2024-04-04 NOTE — Progress Notes (Unsigned)
 "    Complete physical exam  Patient: Martin Osborn   DOB: 1965-05-13   59 y.o. Male  MRN: [id number] Visit Date: 04/05/2024  Today's healthcare provider: Jolynn Spencer, PA-C   No chief complaint on file.  Subjective    Martin Osborn is a 59 y.o. male who presents today for a complete physical exam.  He reports consuming a {diet types:17450} diet. {Exercise:19826} He generally feels {well/fairly well/poorly:18703}. He reports sleeping {well/fairly well/poorly:18703}. He {does/does not:200015} have additional problems to discuss today.  HPI  *** Discussed the use of AI scribe software for clinical note transcription with the patient, who gave verbal consent to proceed.  History of Present Illness     Last depression screening scores    08/15/2022    4:04 PM  PHQ 2/9 Scores  PHQ - 2 Score 3  PHQ- 9 Score 5      Data saved with a previous flowsheet row definition   Last fall risk screening     No data to display         Last Audit-C alcohol  use screening    08/15/2022    4:04 PM  Alcohol  Use Disorder Test (AUDIT)  1. How often do you have a drink containing alcohol ? 2  2. How many drinks containing alcohol  do you have on a typical day when you are drinking? 0  3. How often do you have six or more drinks on one occasion? 0  AUDIT-C Score 2   A score of 3 or more in women, and 4 or more in men indicates increased risk for alcohol  abuse, EXCEPT if all of the points are from question 1   No past medical history on file. No past surgical history on file. Social History   Socioeconomic History   Marital status: Married    Spouse name: Not on file   Number of children: Not on file   Years of education: Not on file   Highest education level: Not on file  Occupational History   Not on file  Tobacco Use   Smoking status: Former    Types: Cigarettes    Passive exposure: Past   Smokeless tobacco: Never  Vaping Use   Vaping status: Never Used  Substance and  Sexual Activity   Alcohol  use: Yes    Alcohol /week: 2.0 standard drinks of alcohol     Types: 2 Cans of beer per week    Comment: occasionally   Drug use: Never   Sexual activity: Not Currently  Other Topics Concern   Not on file  Social History Narrative   Not on file   Social Drivers of Health   Tobacco Use: Medium Risk (03/30/2023)   Patient History    Smoking Tobacco Use: Former    Smokeless Tobacco Use: Never    Passive Exposure: Past  Physicist, Medical Strain: Not on [employer] Insecurity: Not on file  Transportation Needs: Not on file  Physical Activity: Not on file  Stress: Not on file  Social Connections: Not on file  Intimate Partner Violence: Not on file  Depression (PHQ2-9): Medium Risk (08/15/2022)   Depression (PHQ2-9)    PHQ-2 Score: 5  Alcohol  Screen: Low Risk (08/15/2022)   Alcohol  Screen    Last Alcohol  Screening Score (AUDIT): 2  Housing: Not on file  Utilities: Not on file  Health Literacy: Not on file   No family status information on file.   No family history on file. Allergies[1]  Patient Care  Team: Georgeanna Radziewicz, PA-C as PCP - General (Physician Assistant)   Medications: Show/hide medication list[2]  Review of Systems  All other systems reviewed and are negative.  Except see HPI  {Insert previous labs (optional):23779} {See past labs  Heme  Chem  Endocrine  Serology  Results Review (optional):1}  Objective    There were no vitals taken for this visit. {Insert last BP/Wt (optional):23777}{See vitals history (optional):1}    Physical Exam Vitals reviewed.  Constitutional:      General: He is not in acute distress.    Appearance: Normal appearance. He is well-developed. He is not ill-appearing, toxic-appearing or diaphoretic.  HENT:     Head: Normocephalic and atraumatic.     Right Ear: Tympanic membrane, ear canal and external ear normal.     Left Ear: Tympanic membrane, ear canal and external ear normal.     Nose: Nose  normal. No congestion or rhinorrhea.     Mouth/Throat:     Mouth: Mucous membranes are moist.     Pharynx: Oropharynx is clear. No oropharyngeal exudate.  Eyes:     General: No scleral icterus.       Right eye: No discharge.        Left eye: No discharge.     Conjunctiva/sclera: Conjunctivae normal.     Pupils: Pupils are equal, round, and reactive to light.  Neck:     Thyroid: No thyromegaly.     Vascular: No carotid bruit.  Cardiovascular:     Rate and Rhythm: Normal rate and regular rhythm.     Pulses: Normal pulses.     Heart sounds: Normal heart sounds. No murmur heard.    No friction rub. No gallop.  Pulmonary:     Effort: Pulmonary effort is normal. No respiratory distress.     Breath sounds: Normal breath sounds. No wheezing or rales.  Abdominal:     General: Abdomen is flat. Bowel sounds are normal. There is no distension.     Palpations: Abdomen is soft. There is no mass.     Tenderness: There is no abdominal tenderness. There is no right CVA tenderness, left CVA tenderness, guarding or rebound.     Hernia: No hernia is present.  Musculoskeletal:        General: No swelling, tenderness, deformity or signs of injury. Normal range of motion.     Cervical back: Normal range of motion and neck supple. No rigidity or tenderness.     Right lower leg: No edema.     Left lower leg: No edema.  Lymphadenopathy:     Cervical: No cervical adenopathy.  Skin:    General: Skin is warm and dry.     Coloration: Skin is not jaundiced or pale.     Findings: No bruising, erythema, lesion or rash.  Neurological:     Mental Status: He is alert and oriented to person, place, and time. Mental status is at baseline.     Gait: Gait normal.  Psychiatric:        Mood and Affect: Mood normal.        Behavior: Behavior normal.        Thought Content: Thought content normal.        Judgment: Judgment normal.      No results found for any visits on 04/05/24.  Assessment & Plan    Routine  Health Maintenance and Physical Exam  Exercise Activities and Dietary recommendations  Goals   None     Immunization  History  Administered Date(s) Administered   Tdap 03/30/2023    Health Maintenance  Topic Date Due   Colon Cancer Screening  Never done   Pneumococcal Vaccine for age over 54 (1 of 1 - PCV) Never done   Zoster (Shingles) Vaccine (1 of 2) Never done   Flu Shot  Never done   COVID-19 Vaccine (3 - 2025-26 season) 11/09/2023   DTaP/Tdap/Td vaccine (2 - Td or Tdap) 03/29/2033   HPV Vaccine (No Doses Required) Completed   Hepatitis C Screening  Completed   HIV Screening  Completed   Hepatitis B Vaccine  Aged Out   Meningitis B Vaccine  Aged Out    Discussed health benefits of physical activity, and encouraged him to engage in regular exercise appropriate for his age and condition.  Assessment and Plan Assessment & Plan      ***  No follow-ups on file.    The patient was advised to call back or seek an in-person evaluation if the symptoms worsen or if the condition fails to improve as anticipated.  I discussed the assessment and treatment plan with the patient. The patient was provided an opportunity to ask questions and all were answered. The patient agreed with the plan and demonstrated an understanding of the instructions.  I, Joycelyn Liska, PA-C have reviewed all documentation for this visit. The documentation on 04/05/2024  for the exam, diagnosis, procedures, and orders are all accurate and complete.  Jolynn Spencer, [facility], MMS [facility] [phone] (phone) [phone] (fax)   Medical Group    [1] No Known Allergies [2]  Outpatient Medications Prior to Visit  Medication Sig   famotidine  (PEPCID ) 10 MG tablet Take 1 tablet (10 mg total) by mouth 2 (two) times daily.   No facility-administered medications prior to visit.   "

## 2024-04-05 ENCOUNTER — Encounter: Payer: Self-pay | Admitting: Physician Assistant

## 2024-04-05 DIAGNOSIS — K219 Gastro-esophageal reflux disease without esophagitis: Secondary | ICD-10-CM

## 2024-04-05 DIAGNOSIS — Z1211 Encounter for screening for malignant neoplasm of colon: Secondary | ICD-10-CM

## 2024-04-05 DIAGNOSIS — E7849 Other hyperlipidemia: Secondary | ICD-10-CM

## 2024-04-05 DIAGNOSIS — Z0000 Encounter for general adult medical examination without abnormal findings: Secondary | ICD-10-CM

## 2024-04-05 DIAGNOSIS — Z23 Encounter for immunization: Secondary | ICD-10-CM

## 2024-04-05 DIAGNOSIS — Z125 Encounter for screening for malignant neoplasm of prostate: Secondary | ICD-10-CM

## 2024-04-05 DIAGNOSIS — N138 Other obstructive and reflux uropathy: Secondary | ICD-10-CM

## 2024-04-05 DIAGNOSIS — R7989 Other specified abnormal findings of blood chemistry: Secondary | ICD-10-CM
